# Patient Record
Sex: Female | Born: 1985
Health system: Southern US, Community
[De-identification: ages and names within clinical notes are randomized; demographics above are authoritative.]

## PROBLEM LIST (undated history)

## (undated) DIAGNOSIS — F840 Autistic disorder: Secondary | ICD-10-CM

## (undated) HISTORY — DX: Autistic disorder: F84.0

---
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
## 2004-08-07 ENCOUNTER — Encounter: Admission: RE | Admit: 2004-08-07 | Discharge: 2004-08-07 | Payer: Self-pay | Admitting: Family Medicine

## 2012-07-17 ENCOUNTER — Other Ambulatory Visit (HOSPITAL_COMMUNITY): Payer: Self-pay | Admitting: Otolaryngology

## 2012-07-17 DIAGNOSIS — R1310 Dysphagia, unspecified: Secondary | ICD-10-CM

## 2012-07-17 DIAGNOSIS — R198 Other specified symptoms and signs involving the digestive system and abdomen: Secondary | ICD-10-CM

## 2012-07-20 ENCOUNTER — Encounter (HOSPITAL_COMMUNITY): Payer: Self-pay

## 2012-07-22 ENCOUNTER — Ambulatory Visit (HOSPITAL_COMMUNITY)
Admission: RE | Admit: 2012-07-22 | Discharge: 2012-07-22 | Disposition: A | Discharge status: Home / Self Care | Payer: Medicaid Other | Source: Ambulatory Visit | Attending: Otolaryngology | Admitting: Otolaryngology

## 2012-07-22 ENCOUNTER — Ambulatory Visit (HOSPITAL_COMMUNITY)
Admission: RE | Admit: 2012-07-22 | Discharge: 2012-07-22 | Disposition: A | Discharge status: Home / Self Care | Payer: 59 | Source: Ambulatory Visit | Attending: Otolaryngology | Admitting: Otolaryngology

## 2012-07-22 DIAGNOSIS — R059 Cough, unspecified: Secondary | ICD-10-CM | POA: Insufficient documentation

## 2012-07-22 DIAGNOSIS — R1310 Dysphagia, unspecified: Secondary | ICD-10-CM | POA: Insufficient documentation

## 2012-07-22 DIAGNOSIS — F840 Autistic disorder: Secondary | ICD-10-CM | POA: Insufficient documentation

## 2012-07-22 DIAGNOSIS — R198 Other specified symptoms and signs involving the digestive system and abdomen: Secondary | ICD-10-CM

## 2012-07-22 DIAGNOSIS — F79 Unspecified intellectual disabilities: Secondary | ICD-10-CM | POA: Insufficient documentation

## 2012-07-22 DIAGNOSIS — R05 Cough: Secondary | ICD-10-CM | POA: Insufficient documentation

## 2012-07-22 NOTE — Procedures (Signed)
Objective Swallowing Evaluation: Modified Barium Swallowing Study  Patient Details  Name: Jennifer Martinez MRN: [id number] Date of Birth: 1985-08-13  Today's Date: 07/22/2012 Time: 1130-1200 SLP Time Calculation (min): 30 min  Past Medical History: No past medical history on file. Past Surgical History: No past surgical history on file. HPI:  This 27 year old female was referred for OP MBS secondary to frequent gagging episodes.  There is little info. available in the [facility] re: medical history,.  Mother reports pt. has CP and an intellectual disability, as well as autism.  Parents state patient eats a regular diet with thin liquids, and has intermittent gagging and throat clearing that sometimes occurs when eating, but also occurs at other times, including when lying down at night.  There have been no particular foods or liquids associated, as it even occurs at times with water.     Assessment / Plan / Recommendation Clinical Impression  Dysphagia Diagnosis: Within Functional Limits;Suspected primary esophageal dysphagia Clinical impression: Pt. exhibits normal oropharyngeal swallow function during this study, with no evidence of gagging, coughing, choking, or throat clearing behaviors observed.  Pt. was unable to swallow the pill whole with applesauce, but chewed the pill.  All consistencies appeared to pass through the C-P segment without difficulty or delay.  An esophageal sweep revealed what appeared to be reduced esophageal perestalsis with resulting residue, which eventually cleared.  Please see Radiologist's report from esophagram, to follow this study.  It is suspected that pt.'s gagging and throat clearing is secondary to a primary esophageal dysphagia, due to increased pressure from stasis in the esophagus.  Hopefully, the esophagram will provide more  detailed information and dianosis.    Treatment Recommendation  No treatment recommended at this time    Diet Recommendation  Regular;Thin liquid   Liquid Administration via: Cup;Straw Medication Administration: Whole meds with liquid Supervision: Patient able to self feed;Full supervision/cueing for compensatory strategies Compensations: Slow rate;Small sips/bites;Follow solids with liquid Postural Changes and/or Swallow Maneuvers: Seated upright 90 degrees;Upright 30-60 min after meal    Other  Recommendations Oral Care Recommendations: Oral care BID Other Recommendations: Clarify dietary restrictions   Follow Up Recommendations  None    Frequency and Duration        Pertinent Vitals/Pain n/a    SLP Swallow Goals     General Date of Onset: 07/23/11 HPI: This 27 year old female was referred for OP MBS secondary to frequent gagging episodes.  There is little info. available in the [facility] re: medical history,.  Mother reports pt. has CP and an intellectual disability, as well as autism.  Parents state patient eats a regular diet with thin liquids, and has intermittent gagging and throat clearing that sometimes occurs when eating, but also occurs at other times, including when lying down at night.  There have been no particular foods or liquids associated, as it even occurs at times with water. Type of Study: Modified Barium Swallowing Study Reason for Referral: Objectively evaluate swallowing function Previous Swallow Assessment: None found in [facility] Diet Prior to this Study: Regular;Thin liquids Temperature Spikes Noted: No Respiratory Status: Room air History of Recent Intubation: No Behavior/Cognition: Alert;Cooperative;Distractible;Requires cueing;Decreased sustained attention;Pleasant mood Oral Cavity - Dentition: Adequate natural dentition Oral Motor / Sensory Function: Within functional limits Self-Feeding Abilities: Able to feed self Patient Positioning: Upright in chair Baseline Vocal Quality: Clear Volitional Cough: Cognitively unable to elicit Volitional Swallow: Able to  elicit Anatomy: Within functional limits Pharyngeal Secretions: Not observed secondary  MBS    Reason for Referral Objectively evaluate swallowing function   Oral Phase Oral Preparation/Oral Phase Oral Phase: WFL   Pharyngeal Phase Pharyngeal Phase Pharyngeal Phase: Within functional limits  Cervical Esophageal Phase    GO    Cervical Esophageal Phase Cervical Esophageal Phase: [facility]    Functional Assessment Tool Used: Clinical judgement Functional Limitations: Swallowing Swallow Current Status (Z6109): 0 percent impaired, limited or restricted Swallow Goal Status (U0454): 0 percent impaired, limited or restricted Swallow Discharge Status (U9811): 0 percent impaired, limited or restricted    Maryjo Rochester T 07/22/2012, 1:00 PM

## 2012-07-29 ENCOUNTER — Encounter: Payer: Self-pay | Admitting: Gastroenterology

## 2012-08-12 ENCOUNTER — Ambulatory Visit: Payer: Medicaid Other | Admitting: Gastroenterology

## 2013-10-21 DIAGNOSIS — L309 Dermatitis, unspecified: Secondary | ICD-10-CM | POA: Insufficient documentation

## 2013-10-21 DIAGNOSIS — R059 Cough, unspecified: Secondary | ICD-10-CM | POA: Insufficient documentation

## 2013-10-21 DIAGNOSIS — Z207 Contact with and (suspected) exposure to pediculosis, acariasis and other infestations: Secondary | ICD-10-CM | POA: Insufficient documentation

## 2013-11-04 ENCOUNTER — Encounter: Payer: Self-pay | Admitting: Internal Medicine

## 2013-11-04 ENCOUNTER — Ambulatory Visit (INDEPENDENT_AMBULATORY_CARE_PROVIDER_SITE_OTHER)
Admission: RE | Admit: 2013-11-04 | Discharge: 2013-11-04 | Disposition: A | Discharge status: Home / Self Care | Payer: Medicaid Other | Source: Ambulatory Visit | Attending: Internal Medicine | Admitting: Internal Medicine

## 2013-11-04 ENCOUNTER — Ambulatory Visit (INDEPENDENT_AMBULATORY_CARE_PROVIDER_SITE_OTHER): Payer: Commercial Managed Care - PPO | Admitting: Internal Medicine

## 2013-11-04 ENCOUNTER — Encounter (INDEPENDENT_AMBULATORY_CARE_PROVIDER_SITE_OTHER): Payer: Self-pay

## 2013-11-04 VITALS — BP 124/82 | HR 67 | Temp 98.5°F | Ht 62.0 in

## 2013-11-04 DIAGNOSIS — R058 Other specified cough: Secondary | ICD-10-CM

## 2013-11-04 DIAGNOSIS — R05 Cough: Secondary | ICD-10-CM

## 2013-11-04 MED ORDER — FAMOTIDINE 20 MG PO TABS: ORAL_TABLET | ORAL | Status: DC

## 2013-11-04 MED ORDER — PANTOPRAZOLE SODIUM 40 MG PO TBEC: 40.0000 mg | DELAYED_RELEASE_TABLET | Freq: Every day | ORAL | Status: DC

## 2013-11-04 NOTE — Progress Notes (Signed)
   Subjective:    Patient ID: Jennifer PrimusMeredith A Houdeshell, female    DOB: November 09, 1985   MRN: [id number]  HPI  [age] yowf adopted age 29 months with autism/ cp with onset of cough x spring/ summer  2014 seemed assoc with swallowing with eval by ST / esophgram neg 07/22/12    referred by Cammie Fulp to pulmonary clinic 11/04/2013 for incessant daily cough    11/04/2013 1st [facility] office visit/ Wert   Chief Complaint  Patient presents with  . Pulmonary Consult    Referred by Dr Cain Saupeammie Fulp for eval of cough that started approx 1 yr ago. She states that the cough is rarely prod and is sometimes worse around meals and at sometimes at night.   some better with cough drops, no better on clariton. Some better on rob dm.  No obvious other patterns in day to day or daytime variabilty or assoc sob unless during coughing fit or cp or chest tightness, subjective wheeze overt sinus or hb symptoms. No unusual exp hx or h/o childhood pna/ asthma or knowledge of premature birth.  Sleeping ok without nocturnal  or early am exacerbation  of respiratory  c/o's or need for noct saba. Also denies any obvious fluctuation of symptoms with weather or environmental changes or other aggravating or alleviating factors except as outlined above   Current Medications, Allergies, Complete Past Medical History, Past Surgical History, Family History, and Social History were reviewed in [facility] electronic medical record.           Review of Systems  Constitutional: Negative for fever, chills and unexpected weight change.  HENT: Negative for congestion, dental problem, ear pain, nosebleeds, postnasal drip, rhinorrhea, sinus pressure, sneezing, sore throat, trouble swallowing and voice change.   Eyes: Negative for visual disturbance.  Respiratory: Positive for cough and shortness of breath. Negative for choking.   Cardiovascular: Negative for chest pain and leg swelling.  Gastrointestinal: Negative for vomiting, abdominal  pain and diarrhea.  Genitourinary: Negative for difficulty urinating.  Musculoskeletal: Negative for arthralgias.  Skin: Negative for rash.  Neurological: Negative for tremors, syncope and headaches.  Hematological: Does not bruise/bleed easily.       Objective:   Physical Exam  amb wf does not engage in conversation / no coughing though both adopted mother >  father both coughed freq during the interview / prominent pseudowheezing on insp   Wt Readings from Last 3 Encounters:  No data found for Wt      HEENT: nl dentition, turbinates, and orophanx. Nl external ear canals without cough reflex   NECK :  without JVD/Nodes/TM/ nl carotid upstrokes bilaterally   LUNGS: no acc muscle use, clear to A and P bilaterally without cough on insp or exp maneuvers   CV:  RRR  no s3 or murmur or increase in P2, no edema   ABD:  soft and nontender with nl excursion in the supine position. No bruits or organomegaly, bowel sounds nl  MS:  warm without deformities, calf tenderness, cyanosis or clubbing  SKIN: warm and dry without lesions    NEURO:  alert, approp, no deficits     CXR  11/04/2013 :  Mediastinum and hilar structures normal. Lungs are clear. Heart size  normal. No pleural effusion or pneumothorax.       Assessment & Plan:

## 2013-11-04 NOTE — Patient Instructions (Addendum)
Pantoprazole (protonix) 40 mg   Take 30-60 min before first meal of the day and Pepcid 20 mg one bedtime until return to office - this is the best way to tell whether stomach acid is contributing to your problem.    GERD (REFLUX)  is an extremely common cause of respiratory symptoms, many times with no significant heartburn at all.    It can be treated with medication, but also with lifestyle changes including avoidance of late meals, excessive alcohol, smoking cessation, and avoid fatty foods, chocolate, peppermint, colas, red wine, and acidic juices such as orange juice.  NO MINT OR MENTHOL PRODUCTS SO NO COUGH DROPS  USE SUGARLESS CANDY INSTEAD (jolley ranchers or [employer] managertover's or lifesavers)  NO OIL BASED VITAMINS - use powdered substitutes.  For cough > delsym 2 tsp every 12 hours  As needed   Please remember to go to the x-ray department downstairs for your tests - we will call you with the results when they are available.  Please schedule a follow up office visit in 4 weeks, sooner if needed

## 2013-11-04 NOTE — Assessment & Plan Note (Signed)

## 2013-11-05 NOTE — Progress Notes (Signed)
Quick Note:  lmomtcb for pt ______ 

## 2013-11-08 NOTE — Progress Notes (Signed)
Quick Note:  Spoke with pt and notified of results per Dr. Wert. Pt verbalized understanding and denied any questions.  ______ 

## 2013-12-01 ENCOUNTER — Ambulatory Visit (INDEPENDENT_AMBULATORY_CARE_PROVIDER_SITE_OTHER): Payer: Commercial Managed Care - PPO | Admitting: Internal Medicine

## 2013-12-01 ENCOUNTER — Encounter: Payer: Self-pay | Admitting: Internal Medicine

## 2013-12-01 VITALS — BP 118/64 | HR 66 | Ht 66.0 in | Wt 196.0 lb

## 2013-12-01 DIAGNOSIS — R058 Other specified cough: Secondary | ICD-10-CM

## 2013-12-01 DIAGNOSIS — R05 Cough: Secondary | ICD-10-CM

## 2013-12-01 MED ORDER — FAMOTIDINE 20 MG PO TABS: ORAL_TABLET | ORAL | Status: DC

## 2013-12-01 NOTE — Progress Notes (Signed)
Subjective:    Patient ID: Jennifer PrimusMeredith A Dresner, female    DOB: Feb 01, 1985   MRN: [id number]    Brief patient profile:  [age] yowf adopted age 29 months with autism/ cp with onset of cough x spring/ summer  2014 seemed assoc with swallowing with eval by ST / esophgram neg 07/22/12    referred by Cammie Fulp to pulmonary clinic 11/04/2013 for incessant daily cough     History of Present Illness    11/04/2013 1st [facility] office visit/ Mirca Yale   Chief Complaint  Patient presents with  . Pulmonary Consult    Referred by Dr Cain Saupeammie Fulp for eval of cough that started approx 1 yr ago. She states that the cough is rarely prod and is sometimes worse around meals and at sometimes at night.   some better with cough drops, no better on clariton. Some better on rob dm. rec Pantoprazole (protonix) 40 mg   Take 30-60 min before first meal of the day and Pepcid 20 mg one bedtime until return to office - this is the best way to tell whether stomach acid is contributing to your problem.   GERD (REFLUX)   For cough > delsym 2 tsp every 12 hours  As needed    12/01/2013 f/u ov/Harinder Romas re: chronic cough  Chief Complaint  Patient presents with  . Follow-up    Cough is much improved. No new co's today.   worst coughing occuring bfast but also with every meal but 90% better overall and no noct cough  Despite misunderstanding instructions and never started pepcid. Not able to swallow ppi. Not coughing up any food or excess mucus at all and no doe   No obvious day to day or daytime variabilty or assoc  cp or chest tightness, subjective wheeze overt sinus or hb symptoms. No unusual exp hx or h/o childhood pna/ asthma or knowledge of premature birth.  Sleeping ok without nocturnal  or early am exacerbation  of respiratory  c/o's or need for noct saba. Also denies any obvious fluctuation of symptoms with weather or environmental changes or other aggravating or alleviating factors except as outlined above    Current Medications, Allergies, Complete Past Medical History, Past Surgical History, Family History, and Social History were reviewed in [facility] electronic medical record.  ROS  The following are not active complaints unless bolded sore throat, dysphagia, dental problems, itching, sneezing,  nasal congestion or excess/ purulent secretions, ear ache,   fever, chills, sweats, unintended wt loss, pleuritic or exertional cp, hemoptysis,  orthopnea pnd or leg swelling, presyncope, palpitations, heartburn, abdominal pain, anorexia, nausea, vomiting, diarrhea  or change in bowel or urinary habits, change in stools or urine, dysuria,hematuria,  rash, arthralgias, visual complaints, headache, numbness weakness or ataxia or problems with walking or coordination,  change in mood/affect or memory.                                Objective:   Physical Exam  amb wf does not engage in conversation / no coughing though both adopted mother >  father both coughed freq during the interview / prominent pseudowheezing on insp      Wt Readings from Last 3 Encounters:  12/01/13 196 lb (88.905 kg)    Vital signs reviewed  HEENT: nl dentition, turbinates, and orophanx. Nl external ear canals without cough reflex   NECK :  without JVD/Nodes/TM/ nl carotid upstrokes bilaterally  LUNGS: no acc muscle use, clear to A and P bilaterally without cough on insp or exp maneuvers   CV:  RRR  no s3 or murmur or increase in P2, no edema   ABD:  soft and nontender with nl excursion in the supine position. No bruits or organomegaly, bowel sounds nl  MS:  warm without deformities, calf tenderness, cyanosis or clubbing  SKIN: warm and dry without lesions    NEURO:  alert, approp, no deficits     CXR  11/04/2013 :  Mediastinum and hilar structures normal. Lungs are clear. Heart size  normal. No pleural effusion or pneumothorax.       Assessment & Plan:

## 2013-12-01 NOTE — Assessment & Plan Note (Signed)
Clearly better though not on max rx yet   I had an extended summary discussion with the patient / parents today lasting 15 to 20 minutes of a 25 minute visit on the following issues:   Clearly the cough is related to swallowing issues and prob GERD and is much improved and if so should be able to wean off deslym  I would continue max gerd rx for at least 3 m and if flares while on gerd rx proceed to Dx esophagram or refer her to her mother's GI doc [facility]  Pulmonary f/u is prn

## 2013-12-01 NOTE — Patient Instructions (Addendum)
Add pepcid 20 mg at bedtime  Follow up Cammie Fulp and here as needed

## 2013-12-03 ENCOUNTER — Ambulatory Visit: Payer: Medicaid Other | Admitting: Internal Medicine

## 2014-02-02 ENCOUNTER — Other Ambulatory Visit: Payer: Self-pay | Admitting: Internal Medicine

## 2015-12-25 ENCOUNTER — Other Ambulatory Visit (HOSPITAL_COMMUNITY): Payer: Self-pay | Admitting: Specialist

## 2015-12-25 DIAGNOSIS — T17908S Unspecified foreign body in respiratory tract, part unspecified causing other injury, sequela: Secondary | ICD-10-CM

## 2016-01-04 ENCOUNTER — Ambulatory Visit (HOSPITAL_COMMUNITY): Payer: Medicaid Other | Attending: Family Medicine | Admitting: Speech Pathology

## 2016-01-04 ENCOUNTER — Other Ambulatory Visit (HOSPITAL_COMMUNITY): Payer: Commercial Managed Care - PPO

## 2016-01-04 ENCOUNTER — Encounter (HOSPITAL_COMMUNITY): Payer: Self-pay

## 2016-01-11 ENCOUNTER — Other Ambulatory Visit (HOSPITAL_COMMUNITY): Payer: Self-pay | Admitting: Specialist

## 2016-01-11 DIAGNOSIS — R1319 Other dysphagia: Secondary | ICD-10-CM

## 2016-01-25 ENCOUNTER — Encounter (HOSPITAL_COMMUNITY): Payer: Self-pay | Admitting: Speech Pathology

## 2016-01-25 ENCOUNTER — Ambulatory Visit (HOSPITAL_COMMUNITY)
Admission: RE | Admit: 2016-01-25 | Discharge: 2016-01-25 | Disposition: A | Discharge status: Home / Self Care | Payer: Commercial Managed Care - HMO | Source: Ambulatory Visit | Attending: Family Medicine | Admitting: Family Medicine

## 2016-01-25 ENCOUNTER — Ambulatory Visit (HOSPITAL_COMMUNITY): Payer: Medicaid Other | Attending: Family Medicine | Admitting: Speech Pathology

## 2016-01-25 DIAGNOSIS — F840 Autistic disorder: Secondary | ICD-10-CM | POA: Insufficient documentation

## 2016-01-25 DIAGNOSIS — R1319 Other dysphagia: Secondary | ICD-10-CM | POA: Insufficient documentation

## 2016-01-25 DIAGNOSIS — R1312 Dysphagia, oropharyngeal phase: Secondary | ICD-10-CM | POA: Insufficient documentation

## 2016-01-25 NOTE — Therapy (Signed)
[facility] [address] [place], [place], [address] Phone: [phone]   Fax:  [phone]  Modified Barium Swallow  Patient Details  Name: Jennifer Martinez MRN: [id number] Date of Birth: April 08, 1985 No Data Recorded  Encounter Date: 01/25/2016      End of Session - 01/25/16 2259    Visit Number 1   Number of Visits 1   Authorization Type Medicaid   SLP Start Time 1300   SLP Stop Time  1345   SLP Time Calculation (min) 45 min   Activity Tolerance Patient tolerated treatment well      Past Medical History:  Diagnosis Date  . Autism     History reviewed. No pertinent surgical history.  There were no vitals filed for this visit.      Subjective Assessment - 01/25/16 2247    Subjective Pt accompanied by her parents for MBSS due to parental reports of coughing/gagging during meals.   Patient is accompained by: Family member   Special Tests MBSS   Currently in Pain? No/denies          General - 01/25/16 2248      General Information   Date of Onset 12/22/15   HPI Jennifer Martinez is a 31 yo female who was referred by her PCP, Dr. Abigail Miyamotohacker, for MBSS due to her parents' report of coughing and gagging during meals. [name] has a history of CP, intellectual disabilities, and autism. She was adopted by her parents when she was 4618 months old. [place] adult learning [place] and has additional caregivers who help at home. Jennifer Martinez is verbal and responds to my questions, but did not initiate conversation today. Her parents report that Jennifer Martinez will occasionally "cough, choke, gag" when eating. They cannot attribute it to any particular foods, time of day, or when it may occur during a meal. They report that [facility] with her caregiver a couple of weeks ago and began coughing/gagging and were asked to leave because they were disturbing other customers. Jennifer Martinez had MBSS in 2014 and was found  to be WNL. Jennifer Martinez's parents report that she recently started taking a PPI and that it seems to have helped some. EPIC chart review reveals and encounter with Dr. Sherene SiresWert in the past and he recommended trial of PPI, but it is unclear if pt followed though. Pt is unable to swallow pills whole and always masticates them (including the PPI).   Type of Study MBS-Modified Barium Swallow Study   Previous Swallow Assessment MBS 2014 WNL   Diet Prior to this Study Regular;Thin liquids   Temperature Spikes Noted No   Respiratory Status Room air   History of Recent Intubation No   Behavior/Cognition Alert;Cooperative;Pleasant mood;Requires cueing   Oral Cavity Assessment Within Functional Limits   Oral Care Completed by SLP No   Oral Cavity - Dentition Adequate natural dentition   Vision Functional for self feeding   Self-Feeding Abilities Able to feed self   Patient Positioning Upright in chair   Baseline Vocal Quality Normal   Volitional Cough Strong   Volitional Swallow Able to elicit   Anatomy Within functional limits   Pharyngeal Secretions Not observed secondary MBS            Oral Preparation/Oral Phase - 01/25/16 2255      Oral Preparation/Oral Phase   Oral Phase Impaired     Oral - Solids   Oral - Regular  Imparied mastication;Delayed A-P transit  less rotary chewing and more vertical   Oral - Pill Other (Comment);Oral residue;Piecemeal swallowing  Pt cued to attempt to swallow, but she masticated immediately     Electrical stimulation - Oral Phase   Was Electrical Stimulation Used No          Pharyngeal Phase - 01/25/16 2257      Pharyngeal Phase   Pharyngeal Phase Within functional limits     Electrical Stimulation - Pharyngeal Phase   Was Electrical Stimulation Used No          Cricopharyngeal Phase - 01/25/16 2257      Cervical Esophageal Phase   Cervical Esophageal Phase Within functional limits           Plan - 01/25/16 2300    Clinical Impression  Statement Pt seen in the lateral position in hausted chair for MBSS and assessed with barium tinged puree, thin, regular textures, and barium tablet. Pt with mild oral phase dysphagia characterized by impaired rotary mastication which results in mild delay in oral phase; Pt with mild decrease in velar retraction as evidenced by slight superior movement of bolus into velopharynx, however it did not enter into nasal cavity and no residuals remained on posterior pharyngeal wall. Pharyngeal phase WNL across consistencies and textures with no penetration/aspiration or residuals. Esophageal sweep was completed and was unremarkable. SLP cued Jennifer Martinez to try to swallow the barium tablet whole in applesauce, however she immediately masticated the pill which allowed for a view of how patient does with dry crumbly textures. Pt with mild lingual scatter and piecemeal deglutition, but swallowed without incident. The study was reviewed with pt and her parents. Pt's symptoms were not replicated during today's limited (time due to radiation exposure) assessment. SLP suggested that family keep a "food journal" and jot down when Jennifer Martinez has difficulty (note which meal, what the items was, what preceded that, etc) and possibly meet again with SLP for an in office clinical swallow evaluation and bring an entire lunch with her. Her parents report that the medication (has been taking a PPI for ~5 weeks) seems to be helping and they would like to keep a food journal for now and discuss an in office evaluation when we talk in about 4-6 weeks. Family was given my contact information should they have any questions or have concerns until we speak again next month.   Treatment/Interventions SLP instruction and feedback;Patient/family education   Potential to Achieve Goals Good   Potential Considerations Ability to learn/carryover information      Patient will benefit from skilled therapeutic intervention in order to improve the  following deficits and impairments:   Dysphagia, oropharyngeal phase        Recommendations/Treatment - 01/25/16 2257      Swallow Evaluation Recommendations   SLP Diet Recommendations Thin;Age appropriate regular   Liquid Administration via Cup;Straw   Medication Administration Crushed with puree   Supervision Patient able to self feed;Intermittent supervision to cue for compensatory strategies   Postural Changes Seated upright at 90 degrees;Remain upright for at least 30 minutes after feeds/meals          Prognosis - 01/25/16 2258      Prognosis   Prognosis for Safe Diet Advancement Good   Barriers to Reach Goals Cognitive deficits     Individuals Consulted   Consulted and Agree with Results and Recommendations Patient;Family member/caregiver   Family Member Consulted Mother   Report Sent to  Referring physician  Problem List Patient Active Problem List   Diagnosis Date Noted  . Upper airway cough syndrome 11/04/2013   Thank you,  Havery Moros, CCC-SLP [phone]  [facility] 01/25/2016, 3:02 PM  [facility] [address] [place], [place], [address] Phone: [phone]   Fax:  [phone]  Name: YURITZA PAULHUS MRN: [id number] Date of Birth: [birth year]

## 2016-11-24 DIAGNOSIS — J209 Acute bronchitis, unspecified: Secondary | ICD-10-CM | POA: Diagnosis not present

## 2016-12-17 DIAGNOSIS — G809 Cerebral palsy, unspecified: Secondary | ICD-10-CM | POA: Diagnosis not present

## 2016-12-17 DIAGNOSIS — F840 Autistic disorder: Secondary | ICD-10-CM | POA: Diagnosis not present

## 2016-12-17 DIAGNOSIS — J3089 Other allergic rhinitis: Secondary | ICD-10-CM | POA: Diagnosis not present

## 2016-12-17 DIAGNOSIS — Z23 Encounter for immunization: Secondary | ICD-10-CM | POA: Diagnosis not present

## 2016-12-17 DIAGNOSIS — K219 Gastro-esophageal reflux disease without esophagitis: Secondary | ICD-10-CM | POA: Diagnosis not present

## 2017-05-26 DIAGNOSIS — R1110 Vomiting, unspecified: Secondary | ICD-10-CM | POA: Diagnosis not present

## 2017-05-26 DIAGNOSIS — R109 Unspecified abdominal pain: Secondary | ICD-10-CM | POA: Diagnosis not present

## 2017-05-29 ENCOUNTER — Other Ambulatory Visit: Payer: Self-pay | Admitting: Family Medicine

## 2017-05-29 DIAGNOSIS — R7401 Elevation of levels of liver transaminase levels: Secondary | ICD-10-CM

## 2017-05-29 DIAGNOSIS — R740 Nonspecific elevation of levels of transaminase and lactic acid dehydrogenase [LDH]: Principal | ICD-10-CM

## 2017-05-30 ENCOUNTER — Other Ambulatory Visit: Payer: Self-pay | Admitting: Family Medicine

## 2017-05-30 DIAGNOSIS — R7401 Elevation of levels of liver transaminase levels: Secondary | ICD-10-CM

## 2017-05-30 DIAGNOSIS — R740 Nonspecific elevation of levels of transaminase and lactic acid dehydrogenase [LDH]: Principal | ICD-10-CM

## 2017-06-11 ENCOUNTER — Ambulatory Visit
Admission: RE | Admit: 2017-06-11 | Discharge: 2017-06-11 | Disposition: A | Discharge status: Home / Self Care | Payer: Medicare Other | Source: Ambulatory Visit | Attending: Family Medicine | Admitting: Family Medicine

## 2017-06-11 DIAGNOSIS — R748 Abnormal levels of other serum enzymes: Secondary | ICD-10-CM | POA: Diagnosis not present

## 2017-06-11 DIAGNOSIS — K8020 Calculus of gallbladder without cholecystitis without obstruction: Secondary | ICD-10-CM | POA: Diagnosis not present

## 2017-06-11 DIAGNOSIS — R7401 Elevation of levels of liver transaminase levels: Secondary | ICD-10-CM

## 2017-06-11 DIAGNOSIS — R740 Nonspecific elevation of levels of transaminase and lactic acid dehydrogenase [LDH]: Principal | ICD-10-CM

## 2018-01-04 ENCOUNTER — Other Ambulatory Visit: Payer: Self-pay

## 2018-01-04 ENCOUNTER — Emergency Department (HOSPITAL_COMMUNITY): Payer: Medicare Other

## 2018-01-04 ENCOUNTER — Encounter (HOSPITAL_COMMUNITY): Payer: Self-pay | Admitting: Emergency Medicine

## 2018-01-04 ENCOUNTER — Emergency Department (HOSPITAL_COMMUNITY)
Admission: EM | Admit: 2018-01-04 | Discharge: 2018-01-04 | Disposition: A | Discharge status: Home / Self Care | Payer: Medicare Other | Attending: Emergency Medicine | Admitting: Emergency Medicine

## 2018-01-04 DIAGNOSIS — R1011 Right upper quadrant pain: Secondary | ICD-10-CM | POA: Insufficient documentation

## 2018-01-04 DIAGNOSIS — K8020 Calculus of gallbladder without cholecystitis without obstruction: Secondary | ICD-10-CM | POA: Diagnosis not present

## 2018-01-04 DIAGNOSIS — F840 Autistic disorder: Secondary | ICD-10-CM | POA: Diagnosis not present

## 2018-01-04 DIAGNOSIS — Z79899 Other long term (current) drug therapy: Secondary | ICD-10-CM | POA: Diagnosis not present

## 2018-01-04 LAB — CBC WITH DIFFERENTIAL/PLATELET
ABS IMMATURE GRANULOCYTES: 0.02 10*3/uL (ref 0.00–0.07)
Basophils Absolute: 0 10*3/uL (ref 0.0–0.1)
Basophils Relative: 0 %
EOS ABS: 0.1 10*3/uL (ref 0.0–0.5)
Eosinophils Relative: 2 %
HCT: 33.2 % — ABNORMAL LOW (ref 36.0–46.0)
HEMOGLOBIN: 10.6 g/dL — AB (ref 12.0–15.0)
IMMATURE GRANULOCYTES: 0 %
Lymphocytes Relative: 28 %
Lymphs Abs: 1.5 10*3/uL (ref 0.7–4.0)
MCH: 30.3 pg (ref 26.0–34.0)
MCHC: 31.9 g/dL (ref 30.0–36.0)
MCV: 94.9 fL (ref 80.0–100.0)
MONOS PCT: 15 %
Monocytes Absolute: 0.8 10*3/uL (ref 0.1–1.0)
NEUTROS PCT: 55 %
Neutro Abs: 2.9 10*3/uL (ref 1.7–7.7)
Platelets: 233 10*3/uL (ref 150–400)
RBC: 3.5 MIL/uL — ABNORMAL LOW (ref 3.87–5.11)
RDW: 12.8 % (ref 11.5–15.5)
WBC: 5.4 10*3/uL (ref 4.0–10.5)
nRBC: 0 % (ref 0.0–0.2)

## 2018-01-04 LAB — COMPREHENSIVE METABOLIC PANEL
ALBUMIN: 3.5 g/dL (ref 3.5–5.0)
ALT: 31 U/L (ref 0–44)
AST: 20 U/L (ref 15–41)
Alkaline Phosphatase: 39 U/L (ref 38–126)
Anion gap: 12 (ref 5–15)
BILIRUBIN TOTAL: 0.6 mg/dL (ref 0.3–1.2)
BUN: 11 mg/dL (ref 6–20)
CO2: 22 mmol/L (ref 22–32)
CREATININE: 0.9 mg/dL (ref 0.44–1.00)
Calcium: 8.7 mg/dL — ABNORMAL LOW (ref 8.9–10.3)
Chloride: 104 mmol/L (ref 98–111)
GFR calc Af Amer: >60 mL/min (ref >60)
GLUCOSE: 103 mg/dL — AB (ref 70–99)
Potassium: 3.9 mmol/L (ref 3.5–5.1)
Sodium: 138 mmol/L (ref 135–145)
TOTAL PROTEIN: 7.1 g/dL (ref 6.5–8.1)

## 2018-01-04 LAB — URINALYSIS, ROUTINE W REFLEX MICROSCOPIC
BILIRUBIN URINE: NEGATIVE
Glucose, UA: NEGATIVE mg/dL
KETONES UR: 5 mg/dL — AB
Nitrite: NEGATIVE
PH: 7 (ref 5.0–8.0)
Protein, ur: NEGATIVE mg/dL
SPECIFIC GRAVITY, URINE: 1.006 (ref 1.005–1.030)

## 2018-01-04 LAB — LIPASE, BLOOD: LIPASE: 26 U/L (ref 11–51)

## 2018-01-04 MED ORDER — SODIUM CHLORIDE 0.9 % IV BOLUS
1000.0000 mL | Freq: Once | INTRAVENOUS | Status: AC
  Administered 2018-01-04: 1000 mL via INTRAVENOUS

## 2018-01-04 MED ORDER — IOHEXOL 300 MG/ML  SOLN
100.0000 mL | Freq: Once | INTRAMUSCULAR | Status: AC | PRN
  Administered 2018-01-04: 100 mL via INTRAVENOUS

## 2018-01-04 NOTE — ED Notes (Signed)
Pt refusing blood pressures at this time 

## 2018-01-04 NOTE — ED Notes (Signed)
Pt coming from [facility] in, Dr. Hyacinth MeekerMiller sending for evaluation of abd pain. Pt does have developmental delays.

## 2018-01-04 NOTE — ED Notes (Signed)
Patient transported to [facility] 

## 2018-01-04 NOTE — ED Notes (Signed)
POC urine pregnancy= NEGATIVE. 

## 2018-01-04 NOTE — ED Triage Notes (Addendum)
Pt here with her father, father reports pt hasn't been sleeping well and needs to be held all night long due to abd pain, pt denies any pain at this moment. Pt repeating phrases which is her baseline due to [employer]is[employer].

## 2018-01-04 NOTE — Discharge Instructions (Signed)
TAKE 8 CAPFULS OF MIRALAX IN A 32 OUNCE GATORADE AND DRINK THE WHOLE BEVERAGE . Return for worsening abdominal pain, fever, unable to tolerate by mouth.

## 2018-01-04 NOTE — ED Provider Notes (Signed)
[facility] EMERGENCY DEPARTMENT Provider Note   CSN: [id number] Arrival date & time: 01/04/18  1612     History   Chief Complaint Chief Complaint  Patient presents with  . Abdominal Pain    HPI Jennifer Martinez is a 32 y.o. female.  32 yo F with a chief complaint of abdominal pain.  This is noticed usually at night.  She has been up crying and has her grandfather rub her back.  They deny any vomiting or diarrhea.  Going on for the past couple days.  They went to be urgent care center who sent her here for lab work and a CT scan.  They states she has had a very mild cough.  Deny other infectious symptoms.  Denies fevers or chills.  No noted urinary symptoms.  No prior abdominal surgeries.  She has been diagnosed with a gallstone in the past.  No issues eating and drinking.  They were unsure of her recent bowel habits.  Level 5 caveat mental disability.  The history is provided by the patient.  Abdominal Pain   This is a new problem. The current episode started 2 days ago. The problem occurs constantly. The problem has not changed since onset.The pain is located in the generalized abdominal region. The quality of the pain is sharp and shooting. The pain is at a severity of 8/10. The pain is severe. Pertinent negatives include fever, diarrhea, nausea, vomiting, constipation, dysuria, headaches, arthralgias and myalgias. Nothing aggravates the symptoms. Nothing relieves the symptoms.    Past Medical History:  Diagnosis Date  . Autism     Patient Active Problem List   Diagnosis Date Noted  . Upper airway cough syndrome 11/04/2013    History reviewed. No pertinent surgical history.   OB History   No obstetric history on file.      Home Medications    Prior to Admission medications   Medication Sig Start Date End Date Taking? Authorizing Provider  omeprazole (PRILOSEC) 40 MG capsule Take 40 mg by mouth daily.   Yes [provider]  pantoprazole  (PROTONIX) 40 MG tablet TAKE 1 TABLET BY MOUTH 30 TO 60 MINUTES BEFORE FIRST MEAL OF THE DAY Patient not taking: Reported on 01/04/2018 02/02/14   Nyoka Cowden, MD    Family History Family History  Adopted: Yes    Social History Social History   Tobacco Use  . Smoking status: Never Smoker  . Smokeless tobacco: Never Used  Substance Use Topics  . Alcohol use: No  . Drug use: No     Allergies   Patient has no known allergies.   Review of Systems Review of Systems  Constitutional: Negative for chills and fever.  HENT: Negative for congestion and rhinorrhea.   Eyes: Negative for redness and visual disturbance.  Respiratory: Negative for shortness of breath and wheezing.   Cardiovascular: Negative for chest pain and palpitations.  Gastrointestinal: Positive for abdominal pain. Negative for constipation, diarrhea, nausea and vomiting.  Genitourinary: Negative for dysuria and urgency.  Musculoskeletal: Negative for arthralgias and myalgias.  Skin: Negative for pallor and wound.  Neurological: Negative for dizziness and headaches.     Physical Exam Updated Vital Signs BP (!) 115/59   Pulse 82   Temp 98.2 F (36.8 C) (Oral)   Resp 16   SpO2 100%   Physical Exam Vitals signs and nursing note reviewed.  Constitutional:      General: She is not in acute distress.  Appearance: She is well-developed. She is not diaphoretic.  HENT:     Head: Normocephalic and atraumatic.  Eyes:     Pupils: Pupils are equal, round, and reactive to light.  Neck:     Musculoskeletal: Normal range of motion and neck supple.  Cardiovascular:     Rate and Rhythm: Normal rate and regular rhythm.     Heart sounds: No murmur. No friction rub. No gallop.   Pulmonary:     Effort: Pulmonary effort is normal.     Breath sounds: No wheezing or rales.  Abdominal:     General: There is no distension.     Palpations: Abdomen is soft.     Tenderness: There is no abdominal tenderness. There is  no guarding.  Musculoskeletal:        General: No tenderness.  Skin:    General: Skin is warm and dry.  Neurological:     Mental Status: She is alert and oriented to person, place, and time.  Psychiatric:        Behavior: Behavior normal.      ED Treatments / Results  Labs (all labs ordered are listed, but only abnormal results are displayed) Labs Reviewed  CBC WITH DIFFERENTIAL/PLATELET - Abnormal; Notable for the following components:      Result Value   RBC 3.50 (*)    Hemoglobin 10.6 (*)    HCT 33.2 (*)    All other components within normal limits  COMPREHENSIVE METABOLIC PANEL - Abnormal; Notable for the following components:   Glucose, Bld 103 (*)    Calcium 8.7 (*)    All other components within normal limits  URINALYSIS, ROUTINE W REFLEX MICROSCOPIC - Abnormal; Notable for the following components:   Hgb urine dipstick SMALL (*)    Ketones, ur 5 (*)    Leukocytes, UA TRACE (*)    Bacteria, UA RARE (*)    All other components within normal limits  LIPASE, BLOOD  POC URINE PREG, ED    EKG None  Radiology Ct Abdomen Pelvis W Contrast  Result Date: 01/04/2018 CLINICAL DATA:  Abdominal pain and difficulty sleeping for several nights. EXAM: CT ABDOMEN AND PELVIS WITH CONTRAST TECHNIQUE: Multidetector CT imaging of the abdomen and pelvis was performed using the standard protocol following bolus administration of intravenous contrast. CONTRAST:  100 mL OMNIPAQUE IOHEXOL 300 MG/ML  SOLN COMPARISON:  None. FINDINGS: Lower chest: Lung bases are clear. No pleural or pericardial effusion. Hepatobiliary: Multiple stones are seen within the gallbladder. Patient motion mildly limits evaluation but there appears to be some gallbladder wall thickening or tiny amount of pericholecystic fluid. The liver and biliary tree appear normal. Pancreas: Unremarkable. No pancreatic ductal dilatation or surrounding inflammatory changes. Spleen: Normal in size without focal abnormality.  Adrenals/Urinary Tract: Adrenal glands are unremarkable. Kidneys are normal, without renal calculi, focal lesion, or hydronephrosis. Bladder is unremarkable. Stomach/Bowel: Stomach is within normal limits. Appendix appears normal. No evidence of bowel wall thickening, distention, or inflammatory changes. Vascular/Lymphatic: No significant vascular findings are present. No enlarged abdominal or pelvic lymph nodes. Reproductive: Uterus and right ovary are unremarkable. Left ovarian cystic lesion measures 3.6 cm in diameter. Other: None. Musculoskeletal: No acute or focal bony abnormality. Convex left thoracolumbar curvature may be positional. IMPRESSION: Gallstones in a possible small amount of pericholecystic fluid as can be seen in acute cholecystitis. Right upper quadrant ultrasound is recommended for further evaluation. 3.6 cm cystic lesion in the left ovary is likely benign but can not be definitively  characterized. Follow-up ultrasound in 6-12 weeks is recommended. This recommendation follows ACR consensus guidelines: White Paper of the ACR Incidental Findings Committee II on Adnexal Findings. J Am Coll Radiol [id number]. Electronically Signed   By: Drusilla Kannerhomas  Dalessio M.D.   On: 01/04/2018 20:01   [place] Abdomen Limited Ruq  Result Date: 01/04/2018 CLINICAL DATA:  Right flank pain. Transaminitis. Hyperbilirubinemia EXAM: ULTRASOUND ABDOMEN LIMITED RIGHT UPPER QUADRANT COMPARISON:  01/04/2018 FINDINGS: Gallbladder: Numerous small stones, the largest 10 mm. No wall thickening. Negative sonographic Murphy's. Common bile duct: Diameter: Normal caliber, 5 mm Liver: No focal lesion identified. Within normal limits in parenchymal echogenicity. Portal vein is patent on color Doppler imaging with normal direction of blood flow towards the liver. IMPRESSION: Cholelithiasis.  No sonographic evidence of acute cholecystitis. Electronically Signed   By: Charlett NoseKevin  Dover M.D.   On: 01/04/2018 21:01    Procedures Procedures  (including critical care time)  Medications Ordered in ED Medications  sodium chloride 0.9 % bolus 1,000 mL (0 mLs Intravenous Stopped 01/04/18 1759)  iohexol (OMNIPAQUE) 300 MG/ML solution 100 mL (100 mLs Intravenous Contrast Given 01/04/18 1938)     Initial Impression / Assessment and Plan / ED Course  I have reviewed the triage vital signs and the nursing notes.  Pertinent labs & imaging results that were available during my care of the patient were reviewed by me and considered in my medical decision making (see chart for details).     32 yo F with chief complaints of abdominal pain.  Limited hx and exam due to mental disability.  Will obtain labs, CT.   CT scan with possible gallstones, imaging was limited due to patient motion.  Is recommended that she get an ultrasound.  She has no leukocytosis, LFTs are unremarkable.  UA is negative for infection.  Ultrasound was negative for acute cholecystitis. Will d/c home.  PCP follow up.   9:10 PM:  I have discussed the diagnosis/risks/treatment options with the patient and family and believe the pt to be eligible for discharge home to follow-up with PCP. We also discussed returning to the ED immediately if new or worsening sx occur. We discussed the sx which are most concerning (e.g., sudden worsening pain, fever, inability to tolerate by mouth) that necessitate immediate return. Medications administered to the patient during their visit and any new prescriptions provided to the patient are listed below.  Medications given during this visit Medications  sodium chloride 0.9 % bolus 1,000 mL (0 mLs Intravenous Stopped 01/04/18 1759)  iohexol (OMNIPAQUE) 300 MG/ML solution 100 mL (100 mLs Intravenous Contrast Given 01/04/18 1938)     The patient appears reasonably screen and/or stabilized for discharge and I doubt any other medical condition or other [facility] requiring further screening, evaluation, or treatment in the ED at this time prior to  discharge.    Final Clinical Impressions(s) / ED Diagnoses   Final diagnoses:  Abdominal pain, RUQ    ED Discharge Orders    None       Melene PlanFloyd, Nicoya Friel, DO 01/04/18 2110

## 2018-01-04 NOTE — ED Notes (Signed)
Patient transported to CT 

## 2018-01-05 LAB — POC URINE PREG, ED: PREG TEST UR: NEGATIVE

## 2018-11-12 DIAGNOSIS — Z23 Encounter for immunization: Secondary | ICD-10-CM | POA: Diagnosis not present

## 2019-01-02 IMAGING — US US ABDOMEN LIMITED
1 series · 14 of 25 positions shown · non-contrast
Comparison: 01/04/2018

CLINICAL DATA: Right flank pain. Transaminitis. Hyperbilirubinemia

EXAM:
ULTRASOUND ABDOMEN LIMITED RIGHT UPPER QUADRANT

[Series 1: us abdomen limited · 0.22mm/px · 14 of 37 slices shown]
[im 1/37]
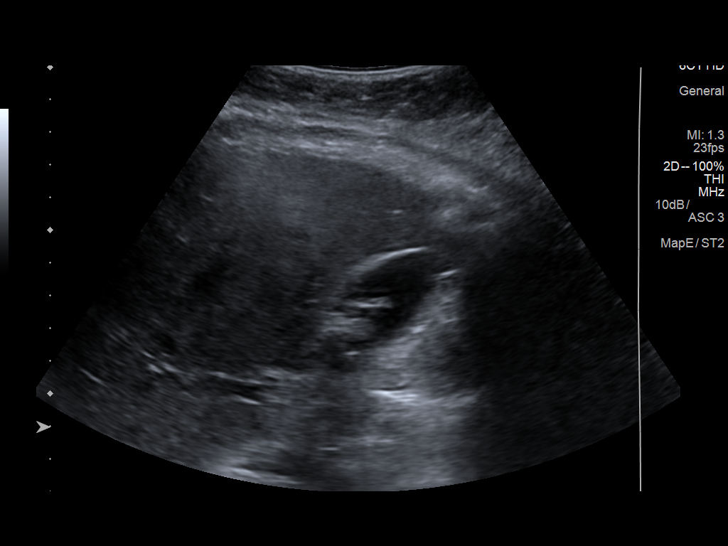
[im 4/37]
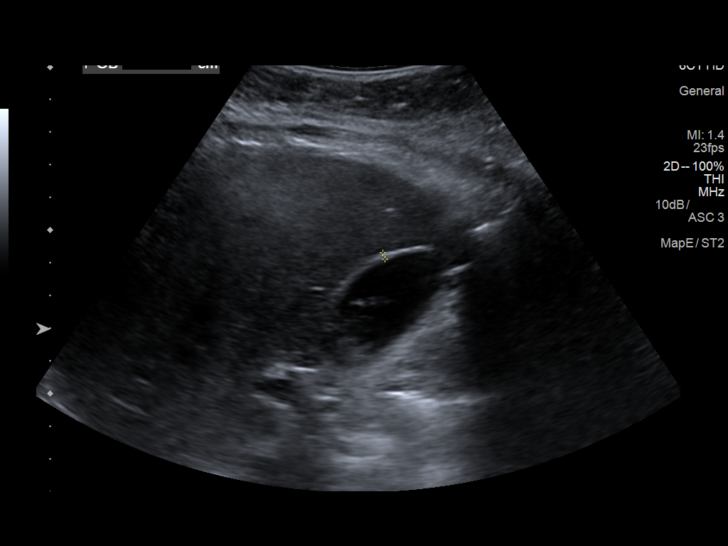
[im 7/37]
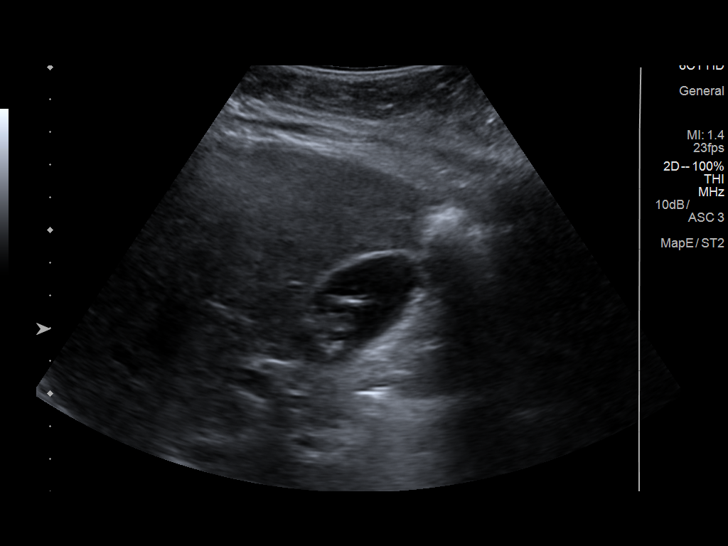
[im 10/37]
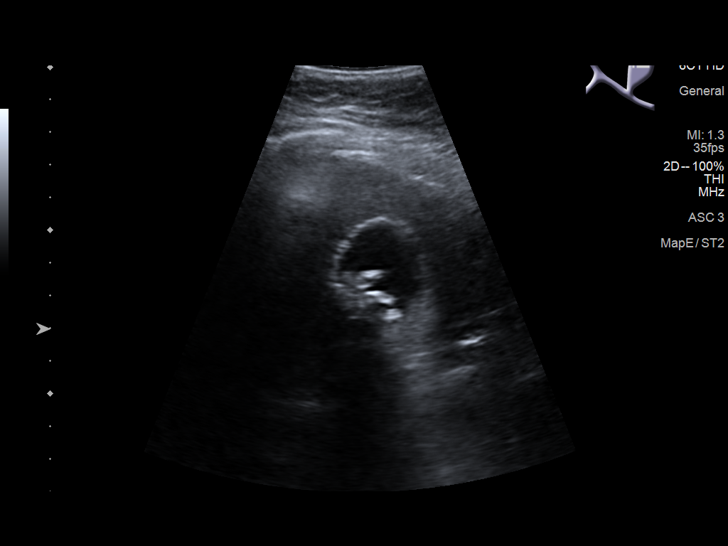
[im 13/37]
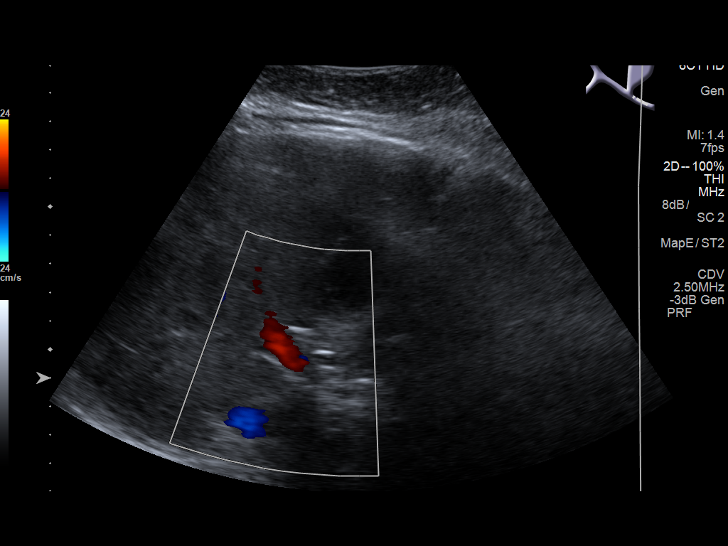
[im 14/37]
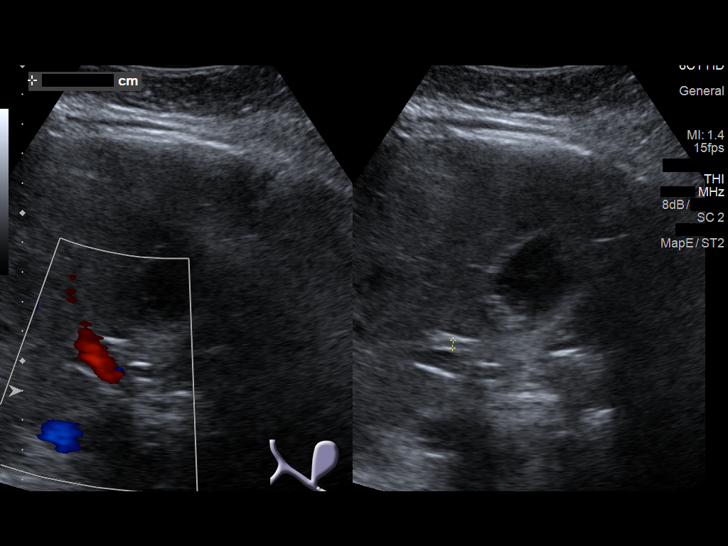
[im 17/37]
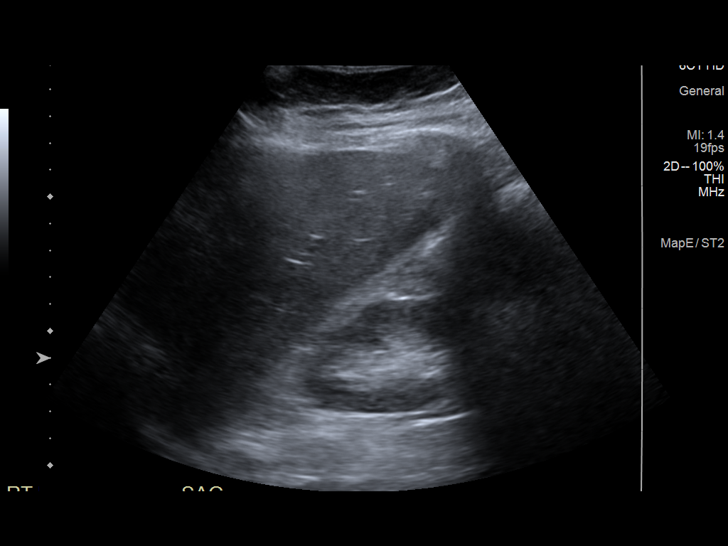
[im 20/37]
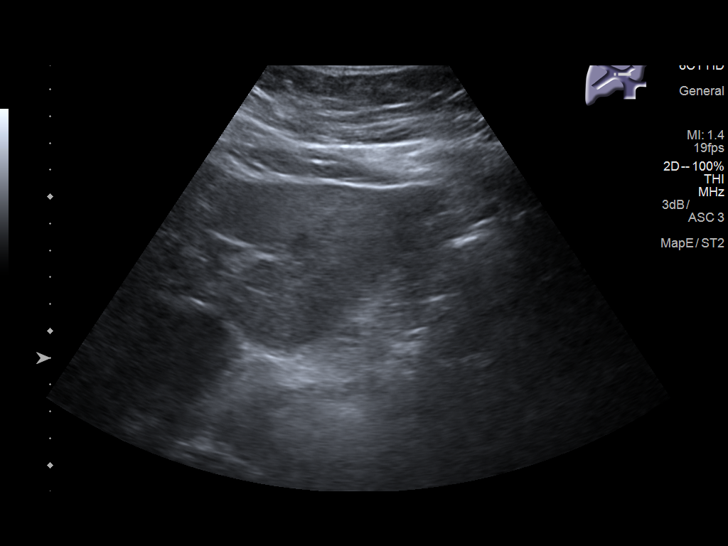
[im 23/37]
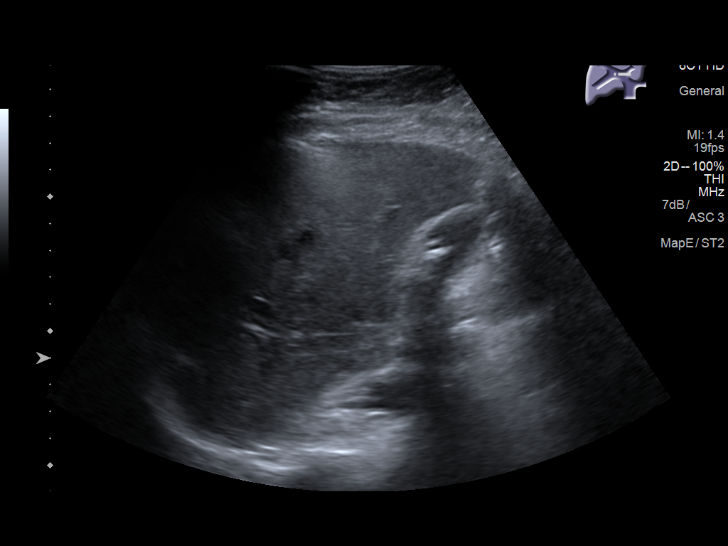
[im 25/37]
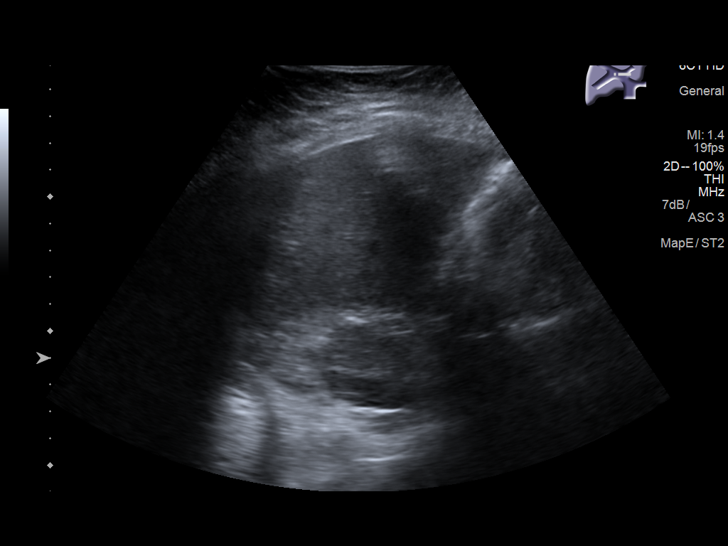
[im 28/37]
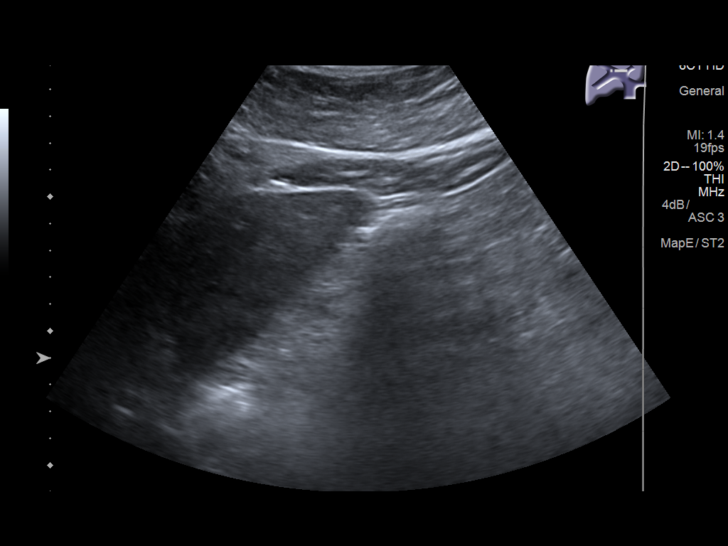
[im 31/37]
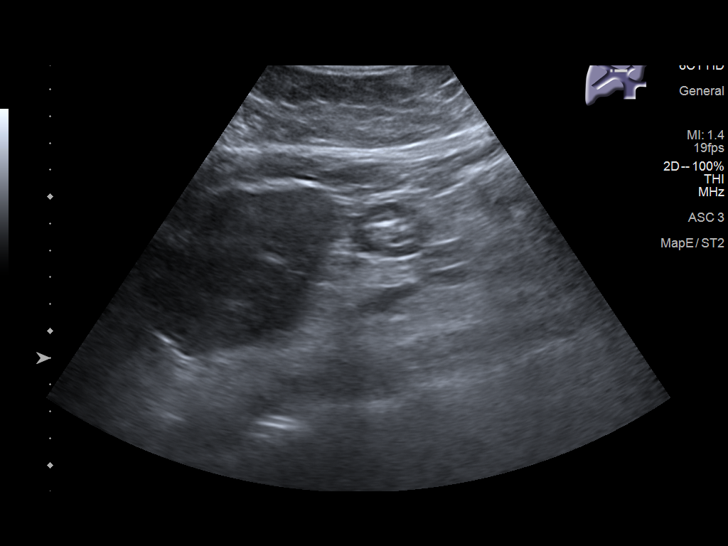
[im 34/37]
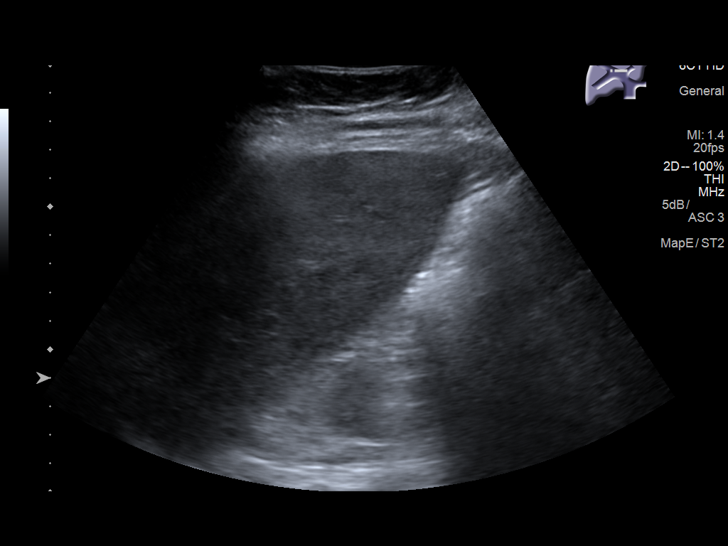
[im 37/37]
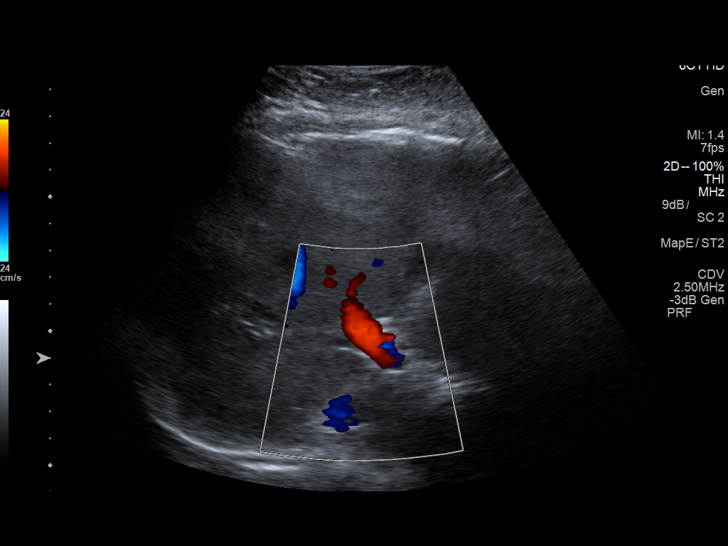

[14 of 25 positions shown; findings below may reference images not displayed]

FINDINGS: Gallbladder:

Numerous small stones, the largest 10 mm. No wall thickening.
Negative sonographic [place].

Common bile duct:

Diameter: Normal caliber, 5 mm

Liver:

No focal lesion identified. Within normal limits in parenchymal
echogenicity. Portal vein is patent on color Doppler imaging with
normal direction of blood flow towards the liver.
IMPRESSION: Cholelithiasis.  No sonographic evidence of acute cholecystitis.

## 2019-03-18 DIAGNOSIS — Z23 Encounter for immunization: Secondary | ICD-10-CM | POA: Diagnosis not present

## 2019-04-16 DIAGNOSIS — Z23 Encounter for immunization: Secondary | ICD-10-CM | POA: Diagnosis not present

## 2019-09-22 DIAGNOSIS — G809 Cerebral palsy, unspecified: Secondary | ICD-10-CM | POA: Diagnosis not present

## 2019-09-22 DIAGNOSIS — F840 Autistic disorder: Secondary | ICD-10-CM | POA: Diagnosis not present

## 2019-09-22 DIAGNOSIS — Z0000 Encounter for general adult medical examination without abnormal findings: Secondary | ICD-10-CM | POA: Diagnosis not present

## 2019-11-30 DIAGNOSIS — Z23 Encounter for immunization: Secondary | ICD-10-CM | POA: Diagnosis not present

## 2020-01-06 DIAGNOSIS — Z23 Encounter for immunization: Secondary | ICD-10-CM | POA: Diagnosis not present

## 2020-01-22 IMAGING — CT CT ABD-PELV W/ CM
2 of 4 series · 16 of 46 positions shown, 18 images · IV contrast (omnipaque)
Comparison: None.

CLINICAL DATA: Abdominal pain and difficulty sleeping for several
nights.

EXAM:
CT ABDOMEN AND PELVIS WITH CONTRAST
TECHNIQUE: Multidetector CT imaging of the abdomen and pelvis was performed
using the standard protocol following bolus administration of
intravenous contrast.
CONTRAST:  100 mL OMNIPAQUE IOHEXOL 300 MG/ML  SOLN

[Series 3: abd/ pelvis 5.0 i30f 2 · axial · 0.98mm/px · z∈[+861,+1271]mm · 13 of 90 slices shown, 15 images]
[im 4/90  soft-tissue]
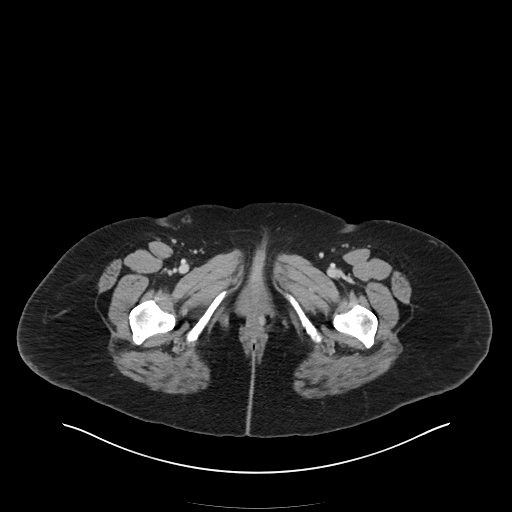
[im 4/90  bone]
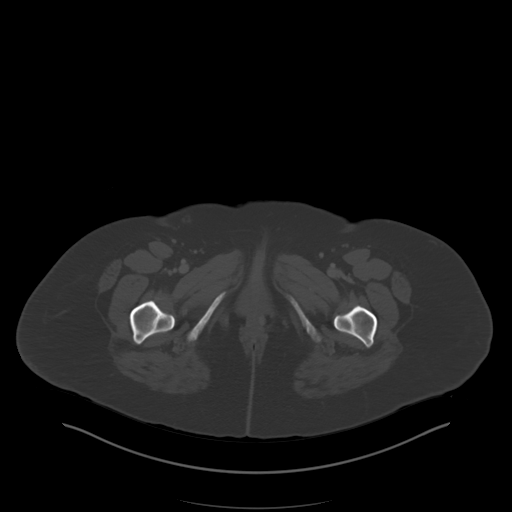
[im 11/90  soft-tissue]
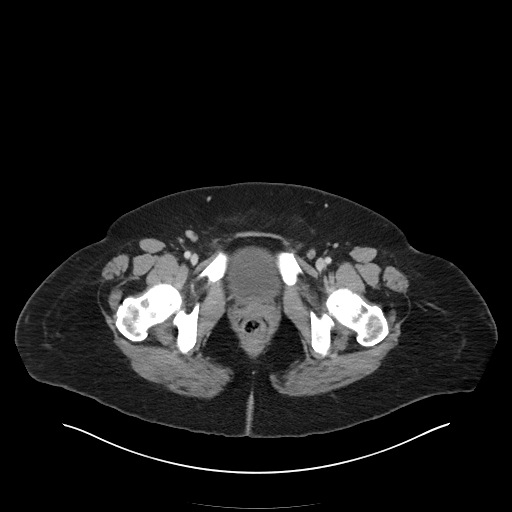
[im 18/90  soft-tissue]
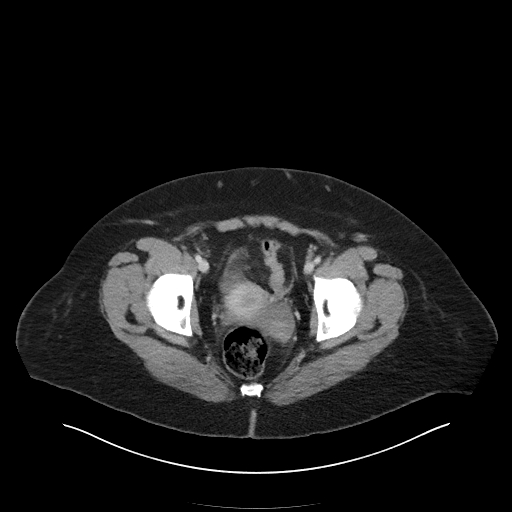
[im 24/90  soft-tissue]
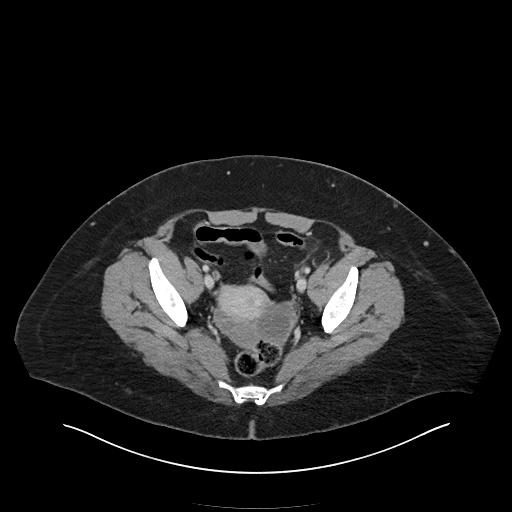
[im 31/90  soft-tissue]
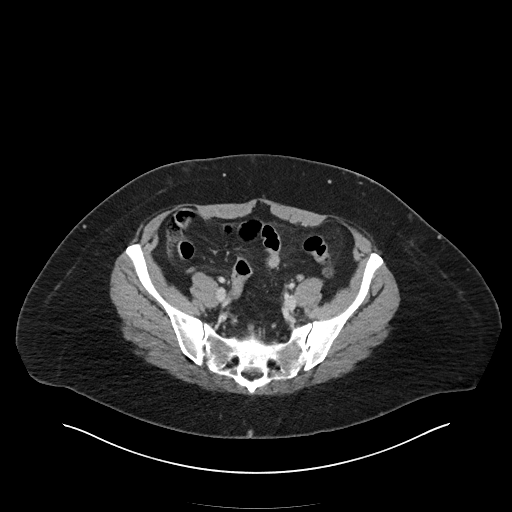
[im 38/90  soft-tissue]
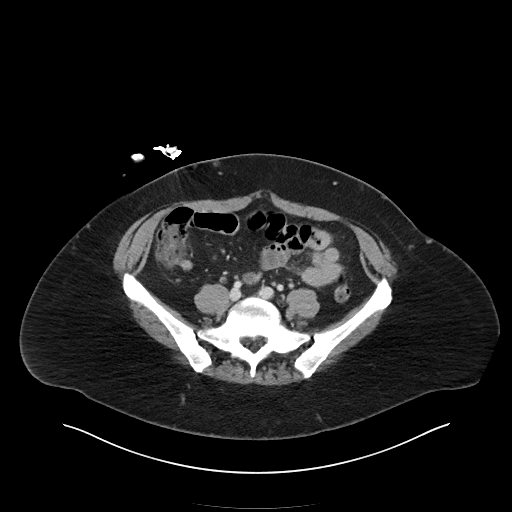
[im 45/90  soft-tissue]
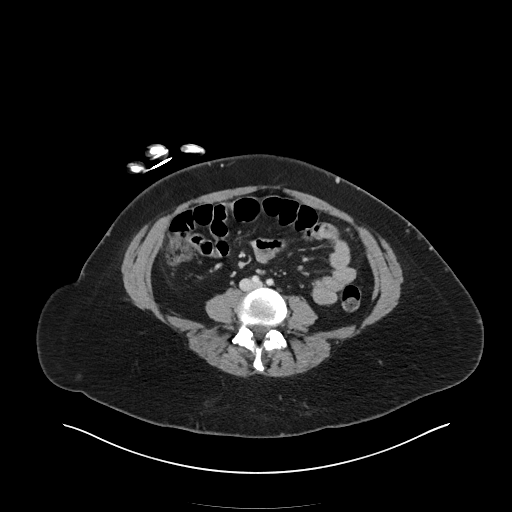
[im 52/90  soft-tissue]
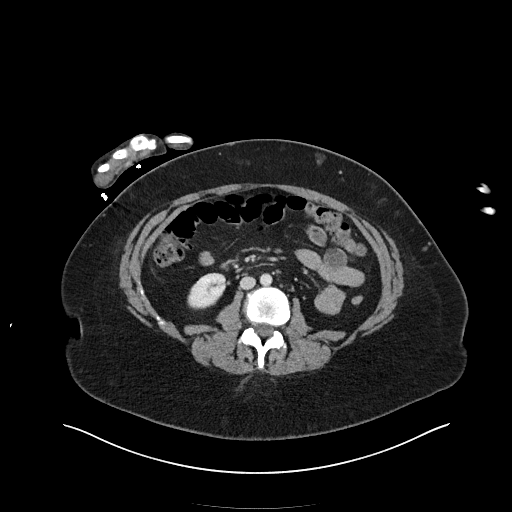
[im 59/90  soft-tissue]
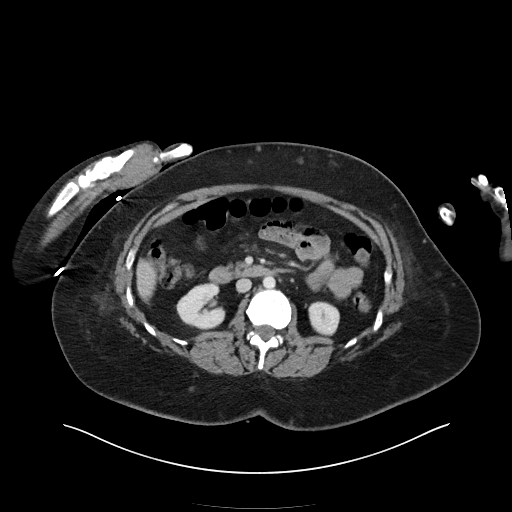
[im 59/90  bone]
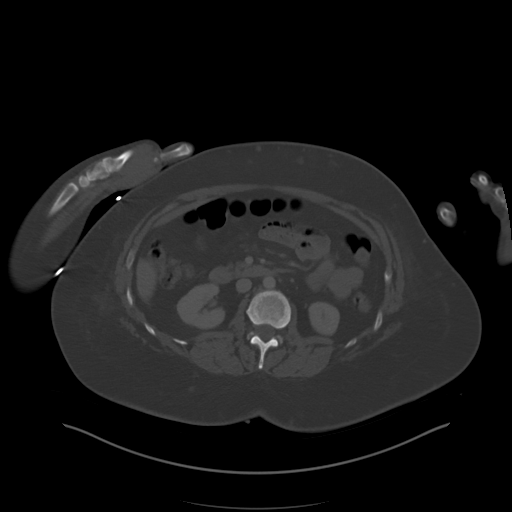
[im 66/90  soft-tissue]
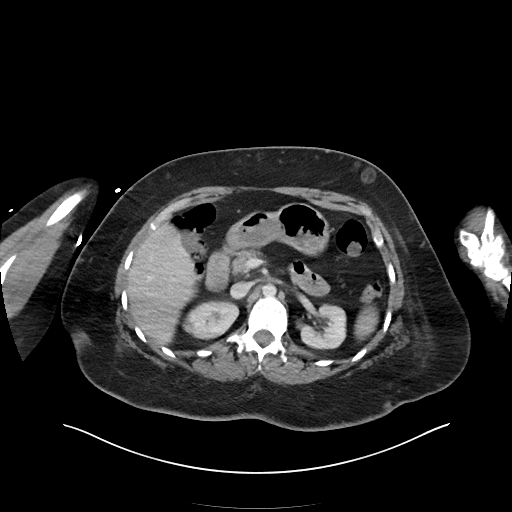
[im 72/90  soft-tissue]
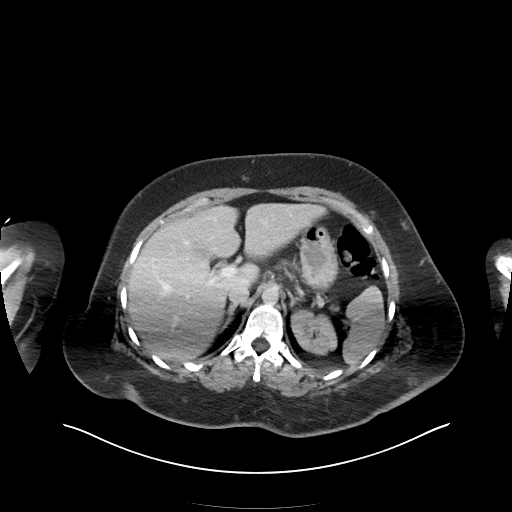
[im 79/90  soft-tissue]
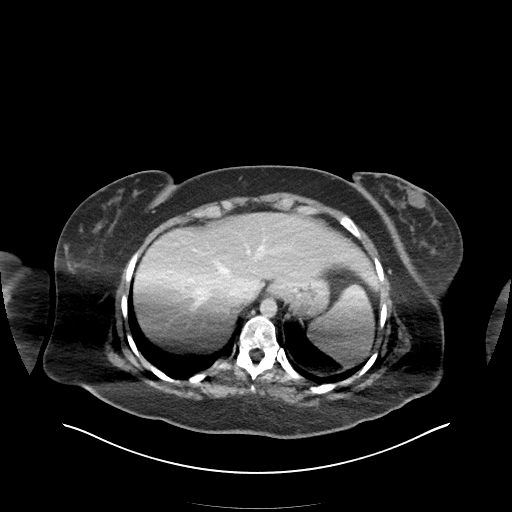
[im 86/90  soft-tissue]
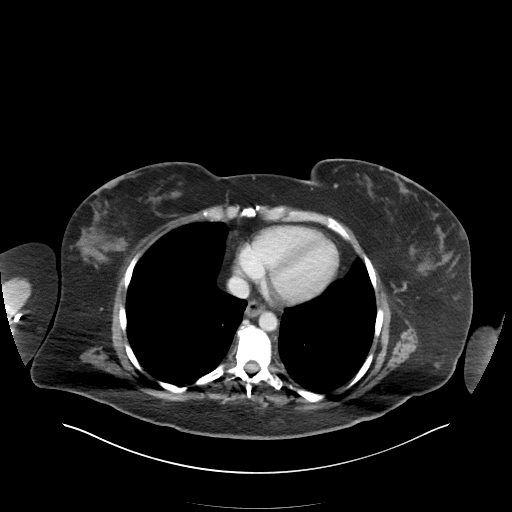

[Series 6: coronal soft tissue · coronal · 0.81mm/px · 3 of 84 slices shown]
[im 28/84  soft-tissue]
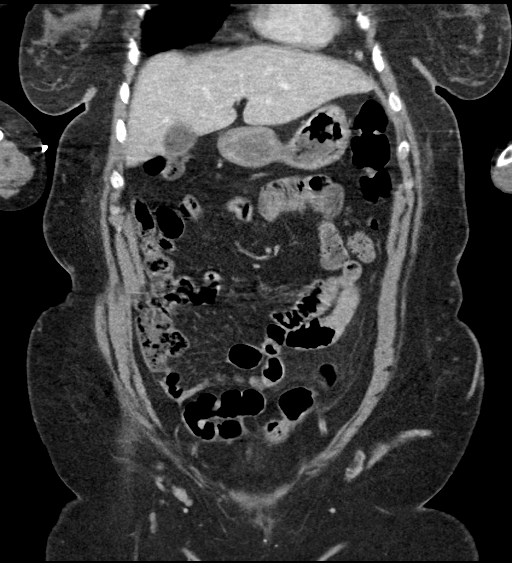
[im 37/84  soft-tissue]
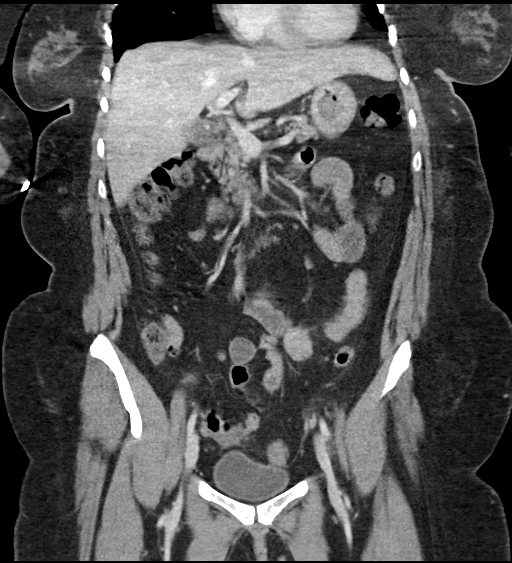
[im 47/84  soft-tissue]
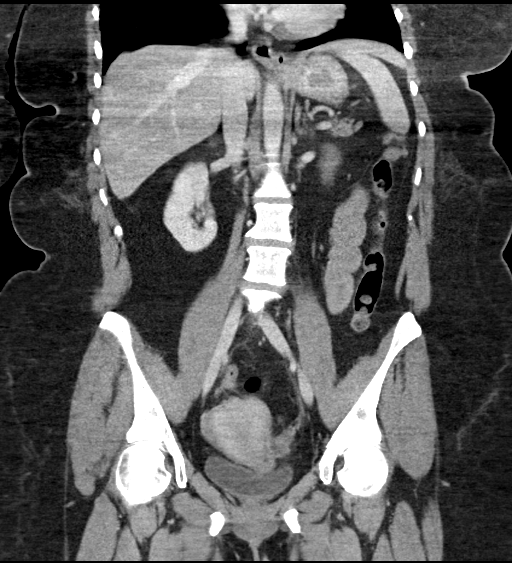

[16 of 46 positions shown; findings below may reference images not displayed]

FINDINGS: Lower chest: Lung bases are clear. No pleural or pericardial
effusion.

Hepatobiliary: Multiple stones are seen within the gallbladder.
Patient motion mildly limits evaluation but there appears to be some
gallbladder wall thickening or tiny amount of pericholecystic fluid.
The liver and biliary tree appear normal.

Pancreas: Unremarkable. No pancreatic ductal dilatation or
surrounding inflammatory changes.

Spleen: Normal in size without focal abnormality.

Adrenals/Urinary Tract: Adrenal glands are unremarkable. Kidneys are
normal, without renal calculi, focal lesion, or hydronephrosis.
Bladder is unremarkable.

Stomach/Bowel: Stomach is within normal limits. Appendix appears
normal. No evidence of bowel wall thickening, distention, or
inflammatory changes.

Vascular/Lymphatic: No significant vascular findings are present. No
enlarged abdominal or pelvic lymph nodes.

Reproductive: Uterus and right ovary are unremarkable. Left ovarian
cystic lesion measures 3.6 cm in diameter.

Other: None.

Musculoskeletal: No acute or focal bony abnormality. Convex left
thoracolumbar curvature may be positional.
IMPRESSION: Gallstones in a possible small amount of pericholecystic fluid as
can be seen in acute cholecystitis. Right upper quadrant ultrasound
is recommended for further evaluation.

3.6 cm cystic lesion in the left ovary is likely benign but can not
be definitively characterized. Follow-up ultrasound in 6-12 weeks is
recommended. This recommendation follows ACR consensus guidelines:
White Paper of the ACR Incidental Findings Committee II on Adnexal
Findings. [HOSPITAL] [DATE].

## 2020-10-19 DIAGNOSIS — Z23 Encounter for immunization: Secondary | ICD-10-CM | POA: Diagnosis not present

## 2020-12-04 DIAGNOSIS — Z23 Encounter for immunization: Secondary | ICD-10-CM | POA: Diagnosis not present

## 2021-11-21 DIAGNOSIS — R059 Cough, unspecified: Secondary | ICD-10-CM | POA: Diagnosis not present

## 2021-11-21 DIAGNOSIS — J40 Bronchitis, not specified as acute or chronic: Secondary | ICD-10-CM | POA: Diagnosis not present

## 2022-06-27 DIAGNOSIS — G809 Cerebral palsy, unspecified: Secondary | ICD-10-CM | POA: Diagnosis not present

## 2022-06-27 DIAGNOSIS — L989 Disorder of the skin and subcutaneous tissue, unspecified: Secondary | ICD-10-CM | POA: Diagnosis not present

## 2022-06-27 DIAGNOSIS — Z0000 Encounter for general adult medical examination without abnormal findings: Secondary | ICD-10-CM | POA: Diagnosis not present

## 2022-06-27 DIAGNOSIS — F840 Autistic disorder: Secondary | ICD-10-CM | POA: Diagnosis not present

## 2022-10-30 DIAGNOSIS — Z23 Encounter for immunization: Secondary | ICD-10-CM | POA: Diagnosis not present

## 2023-03-27 ENCOUNTER — Encounter: Payer: Self-pay | Admitting: Dermatology

## 2023-03-27 ENCOUNTER — Ambulatory Visit (INDEPENDENT_AMBULATORY_CARE_PROVIDER_SITE_OTHER): Payer: Medicare Other | Admitting: Dermatology

## 2023-03-27 VITALS — BP 135/83 | HR 72

## 2023-03-27 DIAGNOSIS — L814 Other melanin hyperpigmentation: Secondary | ICD-10-CM

## 2023-03-27 DIAGNOSIS — L219 Seborrheic dermatitis, unspecified: Secondary | ICD-10-CM

## 2023-03-27 DIAGNOSIS — Z1283 Encounter for screening for malignant neoplasm of skin: Secondary | ICD-10-CM

## 2023-03-27 DIAGNOSIS — L719 Rosacea, unspecified: Secondary | ICD-10-CM

## 2023-03-27 DIAGNOSIS — L821 Other seborrheic keratosis: Secondary | ICD-10-CM | POA: Diagnosis not present

## 2023-03-27 DIAGNOSIS — L578 Other skin changes due to chronic exposure to nonionizing radiation: Secondary | ICD-10-CM

## 2023-03-27 DIAGNOSIS — D1801 Hemangioma of skin and subcutaneous tissue: Secondary | ICD-10-CM | POA: Diagnosis not present

## 2023-03-27 DIAGNOSIS — D229 Melanocytic nevi, unspecified: Secondary | ICD-10-CM

## 2023-03-27 DIAGNOSIS — W908XXA Exposure to other nonionizing radiation, initial encounter: Secondary | ICD-10-CM | POA: Diagnosis not present

## 2023-03-27 NOTE — Patient Instructions (Signed)

## 2023-03-27 NOTE — Progress Notes (Signed)
   New Patient Visit   Subjective  Jennifer Martinez is a 38 y.o. female accompanied by dad and Sister in law who presents for the following:  Total Body Skin Exam (TBSE)  Patient present today for new patient visit for TBSE.The patient reports she has spots, moles and lesions to be evaluated, some may be new or changing and the patient may have concern these could be cancer. Patient has previously been treated by a dermatologist.Patient reports she does not have hx of bx. Patient  unsure  family history of skin cancers as she is adopted. Patient reports throughout her lifetime has had moderate sun exposure. Currently, patient reports if she has excessive sun exposure, she does apply sunscreen and/or wears protective coverings.  The following portions of the chart were reviewed this encounter and updated as appropriate: medications, allergies, medical history  Review of Systems:  No other skin or systemic complaints except as noted in HPI or Assessment and Plan.  Objective  Well appearing patient in no apparent distress; mood and affect are within normal limits.  A full examination was performed including scalp, head, eyes, ears, nose, lips, neck, chest, axillae, abdomen, back, buttocks, bilateral upper extremities, bilateral lower extremities, hands, feet, fingers, toes, fingernails, and toenails. All findings within normal limits unless otherwise noted below.   Relevant exam findings are noted in the Assessment and Plan.    Assessment & Plan   LENTIGINES, SEBORRHEIC KERATOSES, CHERRY ANGIOMAS - Benign normal skin lesions - Benign-appearing - Call for any changes  MELANOCYTIC & Intradermal NEVI - Tan-brown and/or pink-flesh-colored symmetric macules and papules - Benign appearing on exam today - Observation - Call clinic for new or changing moles - Recommend daily use of broad spectrum spf 30+ sunscreen to sun-exposed areas.   ACTINIC DAMAGE - Chronic condition, secondary to  cumulative UV/sun exposure - diffuse scaly erythematous macules with underlying dyspigmentation - Recommend daily broad spectrum sunscreen SPF 30+ to sun-exposed areas, reapply every 2 hours as needed.  - Staying in the shade or wearing long sleeves, sun glasses (UVA+UVB protection) and wide brim hats (4-inch brim around the entire circumference of the hat) are also recommended for sun protection.  - Call for new or changing lesions.  ROSACEA Exam: Mid face erythema with telangiectasias +/- scattered inflammatory papules  Rosacea is a chronic progressive skin condition usually affecting the face of adults, causing redness and/or acne bumps. It is treatable but not curable. It sometimes affects the eyes (ocular rosacea) as well. It may respond to topical and/or systemic medication and can flare with stress, sun exposure, alcohol, exercise, topical steroids (including hydrocortisone/cortisone 10) and some foods.  Daily application of broad spectrum spf 30+ sunscreen to face is recommended to reduce flares.  Patient denies grittiness of the eyes  Seborrheic dermatitis - Assessment: Patient presents with mild dandruff on scalp, consistent with seborrheic dermatitis. Condition attributed to yeast overgrowth.  - Plan:    Provide samples of CeraVe dandruff shampoo containing zinc for topical treatment  SKIN CANCER SCREENING PERFORMED TODAY   No follow-ups on file.  Documentation: I have reviewed the above documentation for accuracy and completeness, and I agree with the above.  Stasia Cavalier, am acting as scribe for Langston Reusing, DO.  Langston Reusing, DO

## 2023-05-15 DIAGNOSIS — R059 Cough, unspecified: Secondary | ICD-10-CM | POA: Diagnosis not present

## 2023-07-08 DIAGNOSIS — E559 Vitamin D deficiency, unspecified: Secondary | ICD-10-CM | POA: Diagnosis not present

## 2023-07-08 DIAGNOSIS — Z0000 Encounter for general adult medical examination without abnormal findings: Secondary | ICD-10-CM | POA: Diagnosis not present

## 2023-07-08 DIAGNOSIS — E782 Mixed hyperlipidemia: Secondary | ICD-10-CM | POA: Diagnosis not present

## 2023-07-08 DIAGNOSIS — K219 Gastro-esophageal reflux disease without esophagitis: Secondary | ICD-10-CM | POA: Diagnosis not present

## 2023-07-08 DIAGNOSIS — Z6831 Body mass index (BMI) 31.0-31.9, adult: Secondary | ICD-10-CM | POA: Diagnosis not present

## 2023-07-08 DIAGNOSIS — G809 Cerebral palsy, unspecified: Secondary | ICD-10-CM | POA: Diagnosis not present

## 2023-07-08 DIAGNOSIS — N390 Urinary tract infection, site not specified: Secondary | ICD-10-CM | POA: Diagnosis not present

## 2023-07-08 DIAGNOSIS — F840 Autistic disorder: Secondary | ICD-10-CM | POA: Diagnosis not present
